# Patient Record
Sex: Male | Born: 1970
Health system: Southern US, Community
[De-identification: ages and names within clinical notes are randomized; demographics above are authoritative.]

## PROBLEM LIST (undated history)

## (undated) DIAGNOSIS — M1990 Unspecified osteoarthritis, unspecified site: Secondary | ICD-10-CM

## (undated) DIAGNOSIS — I10 Essential (primary) hypertension: Secondary | ICD-10-CM

## (undated) HISTORY — PX: KNEE SURGERY: SHX244

## (undated) HISTORY — DX: Unspecified osteoarthritis, unspecified site: M19.90

## (undated) HISTORY — PX: LEG SURGERY: SHX1003

---

## 2002-01-28 ENCOUNTER — Ambulatory Visit (HOSPITAL_BASED_OUTPATIENT_CLINIC_OR_DEPARTMENT_OTHER): Admission: RE | Admit: 2002-01-28 | Discharge: 2002-01-28 | Payer: Self-pay | Admitting: Internal Medicine

## 2005-05-19 ENCOUNTER — Encounter: Admission: RE | Admit: 2005-05-19 | Discharge: 2005-05-19 | Payer: Self-pay | Admitting: Family Medicine

## 2017-03-19 DIAGNOSIS — H1031 Unspecified acute conjunctivitis, right eye: Secondary | ICD-10-CM | POA: Diagnosis not present

## 2017-05-12 DIAGNOSIS — L400 Psoriasis vulgaris: Secondary | ICD-10-CM | POA: Diagnosis not present

## 2017-09-04 DIAGNOSIS — E891 Postprocedural hypoinsulinemia: Secondary | ICD-10-CM | POA: Diagnosis not present

## 2017-10-04 DIAGNOSIS — H52223 Regular astigmatism, bilateral: Secondary | ICD-10-CM | POA: Diagnosis not present

## 2017-12-10 ENCOUNTER — Encounter (HOSPITAL_COMMUNITY): Payer: Self-pay | Admitting: Emergency Medicine

## 2017-12-10 ENCOUNTER — Emergency Department (HOSPITAL_COMMUNITY): Payer: BLUE CROSS/BLUE SHIELD

## 2017-12-10 ENCOUNTER — Emergency Department (HOSPITAL_COMMUNITY)
Admission: EM | Admit: 2017-12-10 | Discharge: 2017-12-10 | Disposition: A | Discharge status: Home / Self Care | Payer: BLUE CROSS/BLUE SHIELD | Attending: Emergency Medicine | Admitting: Emergency Medicine

## 2017-12-10 DIAGNOSIS — R079 Chest pain, unspecified: Secondary | ICD-10-CM

## 2017-12-10 DIAGNOSIS — I10 Essential (primary) hypertension: Secondary | ICD-10-CM | POA: Insufficient documentation

## 2017-12-10 DIAGNOSIS — R0789 Other chest pain: Secondary | ICD-10-CM | POA: Insufficient documentation

## 2017-12-10 HISTORY — DX: Essential (primary) hypertension: I10

## 2017-12-10 LAB — CBC
HCT: 47.8 % (ref 39.0–52.0)
Hemoglobin: 16.9 g/dL (ref 13.0–17.0)
MCH: 33.1 pg (ref 26.0–34.0)
MCHC: 35.4 g/dL (ref 30.0–36.0)
MCV: 93.5 fL (ref 78.0–100.0)
PLATELETS: 193 10*3/uL (ref 150–400)
RBC: 5.11 MIL/uL (ref 4.22–5.81)
RDW: 12 % (ref 11.5–15.5)
WBC: 7 10*3/uL (ref 4.0–10.5)

## 2017-12-10 LAB — BASIC METABOLIC PANEL
Anion gap: 12 (ref 5–15)
BUN: 12 mg/dL (ref 6–20)
CALCIUM: 9.6 mg/dL (ref 8.9–10.3)
CO2: 24 mmol/L (ref 22–32)
CREATININE: 0.79 mg/dL (ref 0.61–1.24)
Chloride: 97 mmol/L — ABNORMAL LOW (ref 101–111)
GFR calc non Af Amer: >60 mL/min (ref >60)
Glucose, Bld: 103 mg/dL — ABNORMAL HIGH (ref 65–99)
Potassium: 3.4 mmol/L — ABNORMAL LOW (ref 3.5–5.1)
SODIUM: 133 mmol/L — AB (ref 135–145)

## 2017-12-10 LAB — I-STAT TROPONIN, ED
TROPONIN I, POC: 0 ng/mL (ref 0.00–0.08)
TROPONIN I, POC: 0.01 ng/mL (ref 0.00–0.08)

## 2017-12-10 NOTE — ED Triage Notes (Signed)
Patient c/o intermittent left sided chest pain for past week about 3 hours ago pt began having dull ache to left side of check radiating into left arm. Pt adds BP has also been increased this week and has taken lisinopril and HTZT as prescribed. Denies any difficulty breathing or nausea.

## 2017-12-10 NOTE — ED Provider Notes (Signed)
[facility] DEPT Provider Note   CSN: [id number] Arrival date & time: 12/10/17  1728     History   Chief Complaint Chief Complaint  Patient presents with  . Chest Pain    HPI John Frederick is a 46 y.o. male.  Patient is a 46 year old male with a history of hypertension who presents with chest pain.  He states is been intermittent for the last week.  It usually lasts about 45 minutes to an hour.  It is nonexertional.  He describes as an achy pain to his left chest.  Today started having some tingling in his left arm.  He has no associated shortness of breath.  No pleuritic pain.  No nausea or vomiting.  No diaphoresis.  No history of heart problems in the past.  He does have hypertension but his cholesterol levels have been normal per his report.  He does have a family history of heart disease in his mom and dad with what he thinks for blockages in their 32s.  He states the pain has been relieved with taking Tums.  He is currently pain-free.      Past Medical History:  Diagnosis Date  . Hypertension     There are no active problems to display for this patient.   Past Surgical History:  Procedure Laterality Date  . KNEE SURGERY Left   . LEG SURGERY Right        Home Medications    Prior to Admission medications   Medication Sig Start Date End Date Taking? Authorizing Provider  calcipotriene (DOVONOX) 0.005 % cream Apply 1 application topically daily.   Yes [provider]  glucosamine-chondroitin 500-400 MG tablet Take 2 tablets by mouth daily.   Yes [provider]  hydrochlorothiazide (MICROZIDE) 12.5 MG capsule Take 12.5 mg by mouth daily.   Yes [provider]  ibuprofen (ADVIL,MOTRIN) 200 MG tablet Take 400 mg by mouth every 6 (six) hours as needed for moderate pain.   Yes [provider]  lisinopril (PRINIVIL,ZESTRIL) 40 MG tablet Take 40 mg by mouth daily.   Yes [provider]     Family History No family history on file.  Social History Social History   Tobacco Use  . Smoking status: Never Smoker  . Smokeless tobacco: Never Used  Substance Use Topics  . Alcohol use: Yes  . Drug use: No     Allergies   Amoxicillin   Review of Systems Review of Systems  Constitutional: Negative for chills, diaphoresis, fatigue and fever.  HENT: Negative for congestion, rhinorrhea and sneezing.   Eyes: Negative.   Respiratory: Negative for cough, chest tightness and shortness of breath.   Cardiovascular: Positive for chest pain. Negative for leg swelling.  Gastrointestinal: Negative for abdominal pain, blood in stool, diarrhea, nausea and vomiting.  Genitourinary: Negative for difficulty urinating, flank pain, frequency and hematuria.  Musculoskeletal: Negative for arthralgias and back pain.  Skin: Negative for rash.  Neurological: Negative for dizziness, speech difficulty, weakness, numbness and headaches.     Physical Exam Updated Vital Signs BP 136/87   Pulse 88   Temp 98.2 F (36.8 C) (Oral)   Resp 13   SpO2 98%   Physical Exam  Constitutional: He is oriented to person, place, and time. He appears well-developed and well-nourished.  HENT:  Head: Normocephalic and atraumatic.  Eyes: Pupils are equal, round, and reactive to light.  Neck: Normal range of motion. Neck supple.  Cardiovascular: Normal rate, regular rhythm  and normal heart sounds.  Pulmonary/Chest: Effort normal and breath sounds normal. No respiratory distress. He has no wheezes. He has no rales. He exhibits no tenderness.  Abdominal: Soft. Bowel sounds are normal. There is no tenderness. There is no rebound and no guarding.  Musculoskeletal: Normal range of motion. He exhibits no edema.  No edema or calf tenderness  Lymphadenopathy:    He has no cervical adenopathy.  Neurological: He is alert and oriented to person, place, and time.  Skin: Skin is warm and dry. No rash noted.   Psychiatric: He has a normal mood and affect.     ED Treatments / Results  Labs (all labs ordered are listed, but only abnormal results are displayed) Labs Reviewed  BASIC METABOLIC PANEL - Abnormal; Notable for the following components:      Result Value   Sodium 133 (*)    Potassium 3.4 (*)    Chloride 97 (*)    Glucose, Bld 103 (*)    All other components within normal limits  CBC  I-STAT TROPONIN, ED  I-STAT TROPONIN, ED    EKG  EKG Interpretation  Date/Time:  Sunday December 10 2017 19:38:00 EST Ventricular Rate:  93 PR Interval:    QRS Duration: 107 QT Interval:  346 QTC Calculation: 431 R Axis:   107 Text Interpretation:  Right and left arm electrode reversal, interpretation assumes no reversal Sinus rhythm Right axis deviation Nonspecific T abnormalities, lateral leads since last tracing no significant change Confirmed by Malvin Johns [phone]) on 12/10/2017 7:40:42 PM       Radiology Dg Chest 2 View  Result Date: 12/10/2017 CLINICAL DATA:  Left upper chest on and off x 4-5 days; no known cardiopulmonary problems; non smoker. Hypertension EXAM: CHEST  2 VIEW COMPARISON:  None. FINDINGS: Shallow inspiration. Normal heart size and pulmonary vascularity. No focal airspace disease or consolidation in the lungs. No blunting of costophrenic angles. No pneumothorax. Mediastinal contours appear intact. IMPRESSION: No active cardiopulmonary disease. Electronically Signed   By: Lucienne Capers M.D.   On: 12/10/2017 18:20    Procedures Procedures (including critical care time)  Medications Ordered in ED Medications - No data to display   Initial Impression / Assessment and Plan / ED Course  I have reviewed the triage vital signs and the nursing notes.  Pertinent labs & imaging results that were available during my care of the patient were reviewed by me and considered in my medical decision making (see chart for details).    Patient is a 46 year old male who  presents with chest pain.  He has no associated symptoms.  No exertional symptoms.  No symptoms that would be more concerning for a PE.  He has a heart score of 2-3.  He has had 2- troponins.  He has had EKGs which have not showed any ischemic changes.  I feel he can be discharged home with close follow-up.  I did advise him to start a antiacid medication which he will get over-the-counter.  He does say that Tums had previously helped his pain.  He will follow-up with his PCP who is in the health clinic at his job site.  I advised him that he will need close follow-up and may be stress test if his symptoms continue.  I did give him a referral to follow-up with cardiology if he is unable to see the PCP at his health clinic.  Return precautions were given.  Final Clinical Impressions(s) / ED Diagnoses   Final  diagnoses:  Nonspecific chest pain    ED Discharge Orders    None       Malvin Johns, MD 12/10/17 [phone]

## 2017-12-20 ENCOUNTER — Encounter (INDEPENDENT_AMBULATORY_CARE_PROVIDER_SITE_OTHER): Payer: BLUE CROSS/BLUE SHIELD

## 2017-12-20 ENCOUNTER — Other Ambulatory Visit: Payer: Self-pay | Admitting: Family Medicine

## 2017-12-20 DIAGNOSIS — R079 Chest pain, unspecified: Secondary | ICD-10-CM

## 2017-12-20 LAB — EXERCISE TOLERANCE TEST
CHL CUP MPHR: 176 {beats}/min
CHL CUP RESTING HR STRESS: 84 {beats}/min
Estimated workload: 9.1 METS
Exercise duration (min): 7 min
Exercise duration (sec): 30 s
Peak HR: 162 {beats}/min
Percent HR: 92 %
RPE: 16

## 2017-12-26 ENCOUNTER — Telehealth: Payer: Self-pay | Admitting: Cardiovascular Disease

## 2017-12-26 ENCOUNTER — Encounter: Payer: Self-pay | Admitting: Cardiovascular Disease

## 2017-12-26 NOTE — Telephone Encounter (Signed)
Called pt and left message asking pt to call back to update his Fm and Medical history and to see who is the pt's PCP.

## 2018-01-08 NOTE — Progress Notes (Signed)
Cardiology Office Note   Date:  01/10/2018   ID:  John Frederick, DOB 15-Apr-1971, MRN [id number]  PCP:  Patient, No Pcp Per  Cardiologist:   Jenkins Rouge, MD   No chief complaint on file.     History of Present Illness: John Frederick is a 47 y.o. male who presents for consultation regarding chest pain. Referred by John Frederick ED Dr Tamera Punt. And Dr Kayleen Memos MD   Reviewed note from 12/10/17  CRF;s HTN.  Pain for a week. Intermittent Can last as long as an hour Non exertional achy pain on left side of chest. Associated with tingling in left arm No pleurisy or dyspnea. Family  History of premature CAD in both parents 7's Tums does help with pain . R/O ECG with no acute changes  CXR with NAD. Reviewed ETT done 12/20/17  John Frederick 7:30 minuts Max HR 162 92%  9.1 METS  HTN response 270 mmHg systolic  No ischemia   He does not exercise much Diet not great Drinks beer daily and probably excessively. Has not had Pains since ETT. 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 Hypertension     Past Surgical History:  Procedure Laterality Date  . KNEE SURGERY Left   . LEG SURGERY Right      Current Outpatient Medications  Medication Sig Dispense Refill  . calcipotriene (DOVONOX) 0.005 % cream Apply 1 application topically daily.    Marland Kitchen glucosamine-chondroitin 500-400 MG tablet Take 2 tablets by mouth daily.    . hydrochlorothiazide (MICROZIDE) 12.5 MG capsule Take 12.5 mg by mouth daily.    Marland Kitchen ibuprofen (ADVIL,MOTRIN) 200 MG tablet Take 400 mg by mouth every 6 (six) hours as needed for moderate pain.    Marland Kitchen lisinopril (PRINIVIL,ZESTRIL) 40 MG tablet Take 40 mg by mouth daily.     No current facility-administered medications for this visit.     Allergies:   Amoxicillin    Social History:  The patient  reports that  has never smoked. he has never used smokeless tobacco. He reports that he  drinks alcohol. He reports that he does not use drugs.   Family History:  The patient's family history includes Alzheimer's disease in his maternal grandmother and paternal grandmother; Heart Problems in his father; Hypertension in his mother.    ROS:  Please see the history of present illness.   Otherwise, review of systems are positive for none.   All other systems are reviewed and negative.    PHYSICAL EXAM: VS:  BP (!) 152/88   Pulse 97   Ht 5' 10.5" (1.791 m)   Wt 264 lb 3.2 oz (119.8 kg)   SpO2 98%   BMI 37.37 kg/m  , BMI Body mass index is 37.37 kg/m. Affect appropriate Overweight white male  HEENT: normal Neck supple with no adenopathy JVP normal no bruits no thyromegaly Lungs clear with no wheezing and good diaphragmatic motion Heart:  S1/S2 no murmur, no rub, gallop or click PMI normal Abdomen: benighn, BS positve, no tenderness, no AAA no bruit.  No HSM or HJR Distal pulses intact with no bruits No edema Neuro non-focal Skin warm and dry No muscular weakness    EKG:  12/11/17 SR rate 87 normal    Recent Labs: 12/10/2017: BUN 12; Creatinine, Ser 0.79; Hemoglobin 16.9; Platelets 193; Potassium 3.4; Sodium 133    Lipid Panel No results  found for: CHOL, TRIG, HDL, CHOLHDL, VLDL, LDLCALC, LDLDIRECT    Wt Readings from Last 3 Encounters:  01/10/18 264 lb 3.2 oz (119.8 kg)      Other studies Reviewed: Additional studies/ records that were reviewed today include: ER notes, Labs CXR ECG F/U ETT .    ASSESSMENT AND PLAN:  1.  Chest pain:  Atypical non exertional r/o relief with TUMS Normal ETT done 12/20/17 likely non cardiac offered him calcium scoring as well to risk stratify in long term  2. HTN: borderline control continue diuretic and ACE discussed lifestyle changes weight loss and diet Also discussed issues ETOH and labile BP  3. GERD: PRN H2 blocker 4. ETOH:  Discussed intake as being excessive and abnormal used term "alcoholism" with him no  family History of such but has 4 or more beers nightly    Current medicines are reviewed at length with the patient today.  The patient does not have concerns regarding medicines.  The following changes have been made:  None   Labs/ tests ordered today include: None  No orders of the defined types were placed in this encounter.    Disposition:   FU with cardiology PRN      Signed, Jenkins Rouge, MD  01/10/2018 8:45 AM    [facility] [address], [place], [place]  [address] Phone: [phone]; Fax: [phone]

## 2018-01-10 ENCOUNTER — Encounter: Payer: Self-pay | Admitting: Cardiovascular Disease

## 2018-01-10 ENCOUNTER — Ambulatory Visit: Payer: BLUE CROSS/BLUE SHIELD | Admitting: Cardiovascular Disease

## 2018-01-10 VITALS — BP 152/88 | HR 97 | Ht 70.5 in | Wt 264.2 lb

## 2018-01-10 DIAGNOSIS — R079 Chest pain, unspecified: Secondary | ICD-10-CM | POA: Diagnosis not present

## 2018-01-10 DIAGNOSIS — I10 Essential (primary) hypertension: Secondary | ICD-10-CM | POA: Diagnosis not present

## 2018-01-10 NOTE — Patient Instructions (Addendum)
Medication Instructions:  Your physician recommends that you continue on your current medications as directed. Please refer to the Current Medication list given to you today.  Labwork: NONE  Testing/Procedures: NONE  Follow-Up: Your physician wants you to follow-up as needed with  Dr. Nishan.    If you need a refill on your cardiac medications before your next appointment, please call your pharmacy.    

## 2019-08-22 ENCOUNTER — Other Ambulatory Visit: Payer: Self-pay

## 2019-08-22 DIAGNOSIS — R6889 Other general symptoms and signs: Secondary | ICD-10-CM | POA: Diagnosis not present

## 2019-08-22 DIAGNOSIS — Z20822 Contact with and (suspected) exposure to covid-19: Secondary | ICD-10-CM

## 2019-08-23 LAB — NOVEL CORONAVIRUS, NAA: SARS-CoV-2, NAA: NOT DETECTED

## 2019-10-01 DIAGNOSIS — L400 Psoriasis vulgaris: Secondary | ICD-10-CM | POA: Diagnosis not present

## 2019-10-03 ENCOUNTER — Other Ambulatory Visit: Payer: Self-pay

## 2019-10-03 DIAGNOSIS — Z20822 Contact with and (suspected) exposure to covid-19: Secondary | ICD-10-CM

## 2019-10-03 DIAGNOSIS — Z20828 Contact with and (suspected) exposure to other viral communicable diseases: Secondary | ICD-10-CM | POA: Diagnosis not present

## 2019-10-06 LAB — NOVEL CORONAVIRUS, NAA: SARS-CoV-2, NAA: NOT DETECTED

## 2019-11-04 ENCOUNTER — Other Ambulatory Visit: Payer: Self-pay

## 2019-11-04 DIAGNOSIS — Z20822 Contact with and (suspected) exposure to covid-19: Secondary | ICD-10-CM

## 2019-11-05 LAB — NOVEL CORONAVIRUS, NAA: SARS-CoV-2, NAA: NOT DETECTED

## 2019-11-27 ENCOUNTER — Other Ambulatory Visit: Payer: Self-pay

## 2019-11-27 ENCOUNTER — Ambulatory Visit: Payer: BC Managed Care – PPO | Attending: Internal Medicine

## 2019-11-27 DIAGNOSIS — Z20822 Contact with and (suspected) exposure to covid-19: Secondary | ICD-10-CM

## 2019-11-27 DIAGNOSIS — Z20828 Contact with and (suspected) exposure to other viral communicable diseases: Secondary | ICD-10-CM | POA: Diagnosis not present

## 2019-11-29 ENCOUNTER — Ambulatory Visit: Payer: BC Managed Care – PPO | Attending: Internal Medicine

## 2019-11-29 DIAGNOSIS — Z20828 Contact with and (suspected) exposure to other viral communicable diseases: Secondary | ICD-10-CM | POA: Diagnosis not present

## 2019-11-29 DIAGNOSIS — Z20822 Contact with and (suspected) exposure to covid-19: Secondary | ICD-10-CM

## 2019-11-29 LAB — NOVEL CORONAVIRUS, NAA: SARS-CoV-2, NAA: NOT DETECTED

## 2019-11-30 LAB — NOVEL CORONAVIRUS, NAA: SARS-CoV-2, NAA: NOT DETECTED

## 2019-12-09 ENCOUNTER — Ambulatory Visit: Payer: BC Managed Care – PPO | Attending: Internal Medicine

## 2019-12-09 DIAGNOSIS — Z20828 Contact with and (suspected) exposure to other viral communicable diseases: Secondary | ICD-10-CM | POA: Diagnosis not present

## 2019-12-09 DIAGNOSIS — Z20822 Contact with and (suspected) exposure to covid-19: Secondary | ICD-10-CM

## 2019-12-09 DIAGNOSIS — Z9889 Other specified postprocedural states: Secondary | ICD-10-CM | POA: Diagnosis not present

## 2019-12-09 DIAGNOSIS — M1712 Unilateral primary osteoarthritis, left knee: Secondary | ICD-10-CM | POA: Diagnosis not present

## 2019-12-11 LAB — NOVEL CORONAVIRUS, NAA: SARS-CoV-2, NAA: NOT DETECTED

## 2020-01-15 ENCOUNTER — Ambulatory Visit: Payer: BC Managed Care – PPO | Attending: Internal Medicine

## 2020-01-15 DIAGNOSIS — Z20822 Contact with and (suspected) exposure to covid-19: Secondary | ICD-10-CM | POA: Diagnosis not present

## 2020-01-16 LAB — NOVEL CORONAVIRUS, NAA: SARS-CoV-2, NAA: NOT DETECTED

## 2020-09-01 ENCOUNTER — Other Ambulatory Visit: Payer: BC Managed Care – PPO

## 2020-09-01 DIAGNOSIS — Z20822 Contact with and (suspected) exposure to covid-19: Secondary | ICD-10-CM | POA: Diagnosis not present

## 2020-09-03 LAB — NOVEL CORONAVIRUS, NAA

## 2020-10-17 ENCOUNTER — Other Ambulatory Visit: Payer: BC Managed Care – PPO

## 2020-11-27 ENCOUNTER — Other Ambulatory Visit: Payer: BC Managed Care – PPO

## 2020-11-27 DIAGNOSIS — Z20822 Contact with and (suspected) exposure to covid-19: Secondary | ICD-10-CM

## 2020-11-28 LAB — SARS-COV-2, NAA 2 DAY TAT

## 2020-11-28 LAB — NOVEL CORONAVIRUS, NAA: SARS-CoV-2, NAA: NOT DETECTED

## 2020-12-14 ENCOUNTER — Other Ambulatory Visit: Payer: BC Managed Care – PPO

## 2020-12-14 DIAGNOSIS — Z20822 Contact with and (suspected) exposure to covid-19: Secondary | ICD-10-CM

## 2020-12-15 LAB — SARS-COV-2, NAA 2 DAY TAT

## 2020-12-15 LAB — SPECIMEN STATUS REPORT

## 2020-12-15 LAB — NOVEL CORONAVIRUS, NAA: SARS-CoV-2, NAA: NOT DETECTED

## 2020-12-21 ENCOUNTER — Other Ambulatory Visit: Payer: Self-pay

## 2020-12-23 ENCOUNTER — Other Ambulatory Visit: Payer: 59

## 2021-07-14 ENCOUNTER — Encounter: Payer: Self-pay | Admitting: Gastroenterology

## 2021-08-06 ENCOUNTER — Encounter: Payer: Self-pay | Admitting: Gastroenterology

## 2021-09-23 ENCOUNTER — Encounter: Payer: 59 | Admitting: Gastroenterology

## 2021-10-01 ENCOUNTER — Other Ambulatory Visit: Payer: Self-pay

## 2021-10-01 ENCOUNTER — Encounter: Payer: Self-pay | Admitting: Gastroenterology

## 2021-10-01 ENCOUNTER — Ambulatory Visit (AMBULATORY_SURGERY_CENTER): Payer: 59

## 2021-10-01 VITALS — Ht 71.0 in | Wt 265.0 lb

## 2021-10-01 DIAGNOSIS — Z1211 Encounter for screening for malignant neoplasm of colon: Secondary | ICD-10-CM

## 2021-10-01 NOTE — Progress Notes (Signed)

## 2021-10-15 ENCOUNTER — Ambulatory Visit (AMBULATORY_SURGERY_CENTER): Payer: 59 | Admitting: Gastroenterology

## 2021-10-15 ENCOUNTER — Encounter: Payer: Self-pay | Admitting: Gastroenterology

## 2021-10-15 ENCOUNTER — Other Ambulatory Visit: Payer: Self-pay

## 2021-10-15 VITALS — BP 128/85 | HR 73 | Temp 96.8°F | Resp 10 | Ht 71.0 in | Wt 265.0 lb

## 2021-10-15 DIAGNOSIS — D123 Benign neoplasm of transverse colon: Secondary | ICD-10-CM

## 2021-10-15 DIAGNOSIS — Z1211 Encounter for screening for malignant neoplasm of colon: Secondary | ICD-10-CM | POA: Diagnosis present

## 2021-10-15 DIAGNOSIS — D122 Benign neoplasm of ascending colon: Secondary | ICD-10-CM | POA: Diagnosis not present

## 2021-10-15 DIAGNOSIS — D128 Benign neoplasm of rectum: Secondary | ICD-10-CM

## 2021-10-15 MED ORDER — SODIUM CHLORIDE 0.9 % IV SOLN: 500.0000 mL | Freq: Once | INTRAVENOUS | Status: DC

## 2021-10-15 NOTE — Progress Notes (Signed)
[facility] History and Physical   Primary Care Physician:  Waldemar Dickens, MD   Reason for Procedure:   Colon cancer screening  Plan:    Screening colonoscopy     HPI: John Frederick is a 50 y.o. male undergoing initial average risk screening colonoscopy.  He has no chronic GI symptoms and no family history of colon cancer.   Past Medical History:  Diagnosis Date   Arthritis    left knee - no meds   Hypertension     Past Surgical History:  Procedure Laterality Date   KNEE SURGERY Left    LEG SURGERY Right     Prior to Admission medications   Medication Sig Start Date End Date Taking? Authorizing Provider  calcipotriene (DOVONOX) 0.005 % cream Apply 1 application topically daily.   Yes [provider]  glucosamine-chondroitin 500-400 MG tablet Take 2 tablets by mouth daily.   Yes [provider]  hydrochlorothiazide (MICROZIDE) 12.5 MG capsule Take 12.5 mg by mouth daily.   Yes [provider]  lisinopril (PRINIVIL,ZESTRIL) 40 MG tablet Take 40 mg by mouth daily.   Yes [provider]  metoprolol succinate (TOPROL-XL) 25 MG 24 hr tablet 25 mg daily. 07/31/20  Yes [provider]  ibuprofen (ADVIL,MOTRIN) 200 MG tablet Take 400 mg by mouth every 6 (six) hours as needed for moderate pain.    [provider]    Current Outpatient Medications  Medication Sig Dispense Refill   calcipotriene (DOVONOX) 0.005 % cream Apply 1 application topically daily.     glucosamine-chondroitin 500-400 MG tablet Take 2 tablets by mouth daily.     hydrochlorothiazide (MICROZIDE) 12.5 MG capsule Take 12.5 mg by mouth daily.     lisinopril (PRINIVIL,ZESTRIL) 40 MG tablet Take 40 mg by mouth daily.     metoprolol succinate (TOPROL-XL) 25 MG 24 hr tablet 25 mg daily.     ibuprofen (ADVIL,MOTRIN) 200 MG tablet Take 400 mg by mouth every 6 (six) hours as needed for moderate pain.     Current Facility-Administered Medications   Medication Dose Route Frequency Provider Last Rate Last Admin   0.9 %  sodium chloride infusion  500 mL Intravenous Once Daryel November, MD        Allergies as of 10/15/2021 - Review Complete 10/15/2021  Allergen Reaction Noted   Amoxicillin Rash 12/10/2017    Family History  Problem Relation Age of Onset   Hypertension Mother    Heart Problems Father    Alzheimer's disease Maternal Grandmother    Alzheimer's disease Paternal Grandmother    Colon cancer Neg Hx    Colon polyps Neg Hx    Esophageal cancer Neg Hx    Rectal cancer Neg Hx    Stomach cancer Neg Hx     Social History   Socioeconomic History   Marital status: Married    Spouse name: PAULA   Number of children: 0   Years of education: COLLEGE   Highest education level: Not on file  Occupational History   Occupation: [employer]  Tobacco Use   Smoking status: Former    Types: Cigarettes    Quit date: 12/13/2003    Years since quitting: 17.8   Smokeless tobacco: Never  Vaping Use   Vaping Use: Never used  Substance and Sexual Activity   Alcohol use: Yes    Alcohol/week: 28.0 standard drinks    Types: 28 Cans of beer per week    Comment: daily beer 4 daily  Drug use: No   Sexual activity: Not on file  Other Topics Concern   Not on file  Social History Narrative   Not on file   Social Determinants of Health   Financial Resource Strain: Not on file  Food Insecurity: Not on file  Transportation Needs: Not on file  Physical Activity: Not on file  Stress: Not on file  Social Connections: Not on file  Intimate Partner Violence: Not on file    Review of Systems:  All other review of systems negative except as mentioned in the HPI.  Physical Exam: Vital signs BP 132/83   Pulse 80   Temp (!) 96.8 F (36 C) (Temporal)   Ht 5\' 11"  (1.803 m)   Wt 265 lb (120.2 kg)   SpO2 98%   BMI 36.96 kg/m   General:   Alert,  Well-developed, well-nourished, pleasant and cooperative in NAD. MP3 Lungs:   Clear throughout to auscultation.   Heart:  Regular rate and rhythm; no murmurs, clicks, rubs,  or gallops. Abdomen:  Soft, nontender and nondistended. Normal bowel sounds.   Neuro/Psych:  Normal mood and affect. A and O x 3   Bryce Cheever E. Candis Schatz, MD [facility]

## 2021-10-15 NOTE — Patient Instructions (Signed)
Discharge instructions given. Handouts on polyps and diverticulosis. Resume previous medications. YOU HAD AN ENDOSCOPIC PROCEDURE TODAY AT THE [facility]:   Refer to the procedure report that was given to you for any specific questions about what was found during the examination.  If the procedure report does not answer your questions, please call your gastroenterologist to clarify.  If you requested that your care partner not be given the details of your procedure findings, then the procedure report has been included in a sealed envelope for you to review at your convenience later.  YOU SHOULD EXPECT: Some feelings of bloating in the abdomen. Passage of more gas than usual.  Walking can help get rid of the air that was put into your GI tract during the procedure and reduce the bloating. If you had a lower endoscopy (such as a colonoscopy or flexible sigmoidoscopy) you may notice spotting of blood in your stool or on the toilet paper. If you underwent a bowel prep for your procedure, you may not have a normal bowel movement for a few days.  Please Note:  You might notice some irritation and congestion in your nose or some drainage.  This is from the oxygen used during your procedure.  There is no need for concern and it should clear up in a day or so.  SYMPTOMS TO REPORT IMMEDIATELY:   Following lower endoscopy (colonoscopy or flexible sigmoidoscopy):  Excessive amounts of blood in the stool  Significant tenderness or worsening of abdominal pains  Swelling of the abdomen that is new, acute  Fever of 100F or higher   For urgent or emergent issues, a gastroenterologist can be reached at any hour by calling [phone]. Do not use MyChart messaging for urgent concerns.    DIET:  We do recommend a small meal at first, but then you may proceed to your regular diet.  Drink plenty of fluids but you should avoid alcoholic beverages for 24 hours.  ACTIVITY:  You should plan to take  it easy for the rest of today and you should NOT DRIVE or use heavy machinery until tomorrow (because of the sedation medicines used during the test).    FOLLOW UP: Our staff will call the number listed on your records 48-72 hours following your procedure to check on you and address any questions or concerns that you may have regarding the information given to you following your procedure. If we do not reach you, we will leave a message.  We will attempt to reach you two times.  During this call, we will ask if you have developed any symptoms of COVID 19. If you develop any symptoms (ie: fever, flu-like symptoms, shortness of breath, cough etc.) before then, please call [phone].  If you test positive for Covid 19 in the 2 weeks post procedure, please call and report this information to us.    If any biopsies were taken you will be contacted by phone or by letter within the next 1-3 weeks.  Please call us at [phone] if you have not heard about the biopsies in 3 weeks.    SIGNATURES/CONFIDENTIALITY: You and/or your care partner have signed paperwork which will be entered into your electronic medical record.  These signatures attest to the fact that that the information above on your After Visit Summary has been reviewed and is understood.  Full responsibility of the confidentiality of this discharge information lies with you and/or your care-partner. 

## 2021-10-15 NOTE — Op Note (Signed)
[facility] Patient Name: John Frederick Procedure Date: 10/15/2021 9:42 AM MRN: [id number] Endoscopist: Nicki Reaper E. Candis Schatz , MD Age: 50 Referring MD:  Date of Birth: Mar 29, 1971 Gender: Male Account #: [id number] Procedure:                Colonoscopy Indications:              Screening for colorectal malignant neoplasm, This                            is the patient's first colonoscopy Medicines:                Monitored Anesthesia Care Procedure:                Pre-Anesthesia Assessment:                           - Prior to the procedure, a History and Physical                            was performed, and patient medications and                            allergies were reviewed. The patient's tolerance of                            previous anesthesia was also reviewed. The risks                            and benefits of the procedure and the sedation                            options and risks were discussed with the patient.                            All questions were answered, and informed consent                            was obtained. Prior Anticoagulants: The patient has                            taken no previous anticoagulant or antiplatelet                            agents. ASA Grade Assessment: II - A patient with                            mild systemic disease. After reviewing the risks                            and benefits, the patient was deemed in                            satisfactory condition to undergo the procedure.  After obtaining informed consent, the colonoscope                            was passed under direct vision. Throughout the                            procedure, the patient's blood pressure, pulse, and                            oxygen saturations were monitored continuously. The                            CF [id number] #[id number] was introduced through the anus                            and advanced to the  the terminal ileum, with                            identification of the appendiceal orifice and IC                            valve. The colonoscopy was performed without                            difficulty. The patient tolerated the procedure                            well. The quality of the bowel preparation was                            adequate. The terminal ileum, ileocecal valve,                            appendiceal orifice, and rectum were photographed.                            The bowel preparation used was Miralax via split                            dose instruction. Scope In: 9:52:01 AM Scope Out: 10:15:36 AM Scope Withdrawal Time: 0 hours 17 minutes 2 seconds  Total Procedure Duration: 0 hours 23 minutes 35 seconds  Findings:                 The perianal and digital rectal examinations were                            normal. Pertinent negatives include normal                            sphincter tone and no palpable rectal lesions.                           A 10 mm polyp was found in the ascending colon. The  polyp was sessile. The polyp was removed with a                            cold snare. Resection and retrieval were complete.                            Estimated blood loss was minimal.                           Three sessile polyps were found in the transverse                            colon. The polyps were 4 to 7 mm in size. These                            polyps were removed with a cold snare. Resection                            and retrieval were complete. Estimated blood loss                            was minimal.                           A 3 mm polyp was found in the rectum. The polyp was                            sessile. The polyp was removed with a cold snare.                            Resection and retrieval were complete. Estimated                            blood loss was minimal.                           Many  small and large-mouthed diverticula were found                            in the sigmoid colon, descending colon, transverse                            colon and ascending colon. There was no evidence of                            diverticular bleeding.                           The exam was otherwise normal throughout the                            examined colon.  The terminal ileum appeared normal.                           The retroflexed view of the distal rectum and anal                            verge was normal and showed no anal or rectal                            abnormalities. Complications:            No immediate complications. Estimated Blood Loss:     Estimated blood loss was minimal. Impression:               - One 10 mm polyp in the ascending colon, removed                            with a cold snare. Resected and retrieved.                           - Three 4 to 7 mm polyps in the transverse colon,                            removed with a cold snare. Resected and retrieved.                           - One 3 mm polyp in the rectum, removed with a cold                            snare. Resected and retrieved.                           - Moderate diverticulosis in the sigmoid colon, in                            the descending colon, in the transverse colon and                            in the ascending colon. There was no evidence of                            diverticular bleeding.                           - The examined portion of the ileum was normal.                           - The distal rectum and anal verge are normal on                            retroflexion view. Recommendation:           - Patient has a contact number available for  emergencies. The signs and symptoms of potential                            delayed complications were discussed with the                            patient. Return to normal  activities tomorrow.                            Written discharge instructions were provided to the                            patient.                           - Resume previous diet.                           - Continue present medications.                           - Await pathology results.                           - Repeat colonoscopy in 3 years for surveillance. Margarita Bobrowski E. Candis Schatz, MD 10/15/2021 10:22:18 AM This report has been signed electronically.

## 2021-10-15 NOTE — Progress Notes (Signed)
PT taken to PACU. Monitors in place. VSS. Report given to RN. 

## 2021-10-15 NOTE — Progress Notes (Signed)
VS completed by AG.    Pt's states no medical or surgical changes since previsit or office visit.

## 2021-10-15 NOTE — Progress Notes (Signed)
Called to room to assist during endoscopic procedure.  Patient ID and intended procedure confirmed with present staff. Received instructions for my participation in the procedure from the performing physician.  

## 2021-10-19 ENCOUNTER — Telehealth: Payer: Self-pay | Admitting: *Deleted

## 2021-10-19 NOTE — Telephone Encounter (Signed)
  Follow up Call-  Call back number 10/15/2021  Post procedure Call Back phone  # [phone]  Permission to leave phone message Yes  Some recent data might be hidden     Patient questions:  Do you have a fever, pain , or abdominal swelling? No. Pain Score  0 *  Have you tolerated food without any problems? Yes.    Have you been able to return to your normal activities? Yes.    Do you have any questions about your discharge instructions: Diet   No. Medications  No. Follow up visit  No.  Do you have questions or concerns about your Care? No.  Actions: * If pain score is 4 or above: No action needed, pain <4.  Have you developed a fever since your procedure? no  2.   Have you had an respiratory symptoms (SOB or cough) since your procedure? no  3.   Have you tested positive for COVID 19 since your procedure no  4.   Have you had any family members/close contacts diagnosed with the COVID 19 since your procedure?  no   If yes to any of these questions please route to Joylene John, RN and Joella Prince, RN

## 2021-10-25 NOTE — Progress Notes (Signed)
John Frederick,   All the polyps that I removed during your recent procedure were completely benign but were proven to be "pre-cancerous" polyps that MAY have grown into cancers if they had not been removed.  Studies shows that at least 20% of women over age 48 and 30% of men over age 27 have pre-cancerous polyps.  Based on current nationally recognized surveillance guidelines, I recommend that you have a repeat colonoscopy in 3 years.   If you develop any new rectal bleeding, abdominal pain or significant bowel habit changes, please contact me before then.

## 2022-02-01 ENCOUNTER — Ambulatory Visit
Admission: RE | Admit: 2022-02-01 | Discharge: 2022-02-01 | Disposition: A | Discharge status: Home / Self Care | Payer: 59 | Source: Ambulatory Visit | Attending: Family Medicine | Admitting: Family Medicine

## 2022-02-01 ENCOUNTER — Other Ambulatory Visit: Payer: Self-pay

## 2022-02-01 ENCOUNTER — Other Ambulatory Visit: Payer: Self-pay | Admitting: Family Medicine

## 2022-02-01 DIAGNOSIS — J189 Pneumonia, unspecified organism: Secondary | ICD-10-CM

## 2022-02-01 NOTE — Progress Notes (Signed)
6 weeks of cough that has not resolved w/ Azithromycin and prednisone. Continues to have productive cough.

## 2022-02-28 ENCOUNTER — Other Ambulatory Visit: Payer: Self-pay | Admitting: Family Medicine

## 2022-02-28 DIAGNOSIS — R079 Chest pain, unspecified: Secondary | ICD-10-CM

## 2022-02-28 NOTE — Progress Notes (Signed)
Recurrent CAP and chest pain ?

## 2022-03-01 ENCOUNTER — Ambulatory Visit
Admission: RE | Admit: 2022-03-01 | Discharge: 2022-03-01 | Disposition: A | Discharge status: Home / Self Care | Payer: 59 | Source: Ambulatory Visit | Attending: Family Medicine | Admitting: Family Medicine

## 2022-03-01 DIAGNOSIS — R079 Chest pain, unspecified: Secondary | ICD-10-CM

## 2022-03-01 MED ORDER — IOPAMIDOL (ISOVUE-300) INJECTION 61%
75.0000 mL | Freq: Once | INTRAVENOUS | Status: AC | PRN
  Administered 2022-03-01: 75 mL via INTRAVENOUS

## 2022-03-31 ENCOUNTER — Institutional Professional Consult (permissible substitution): Payer: 59 | Admitting: Pulmonary Disease

## 2022-09-23 IMAGING — CT CT CHEST W/ CM
2 of 5 series · 15 of 36 positions shown, 18 images · IV contrast (agent unspecified)
Comparison: None.

CLINICAL DATA: Chest pain

EXAM:
CT CHEST WITH CONTRAST
TECHNIQUE: Multidetector CT imaging of the chest was performed during
intravenous contrast administration.

[Series 4: chest 2.00 br40 s3 · coronal · 0.69mm/px · 3 of 185 slices shown]
[im 37/185  lung]
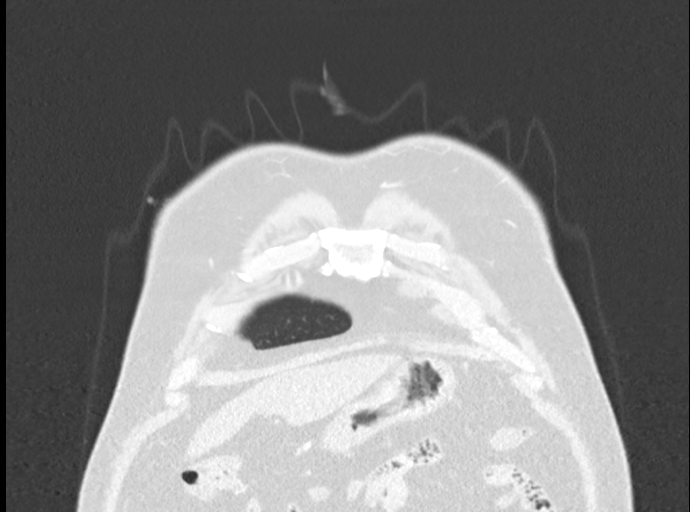
[im 74/185  lung]
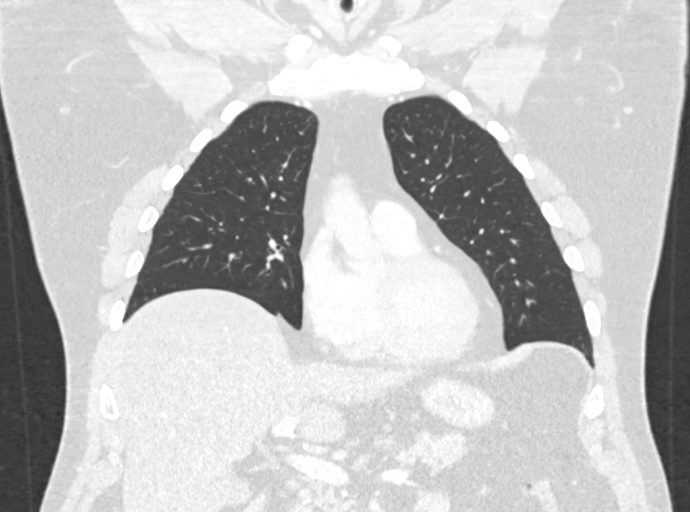
[im 111/185  lung]
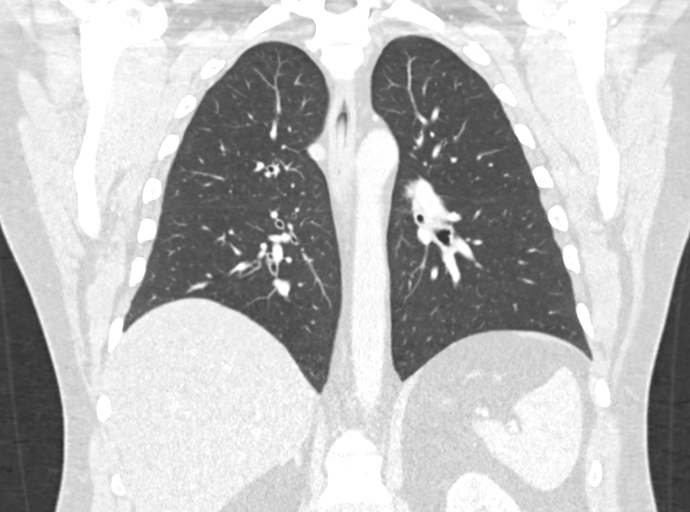

[Series 10: chest 1.00 br40 s3 · axial · 0.87mm/px · z∈[+1285,+1589]mm · 12 of 440 slices shown, 15 images]
[im 30/440  mediastinal]
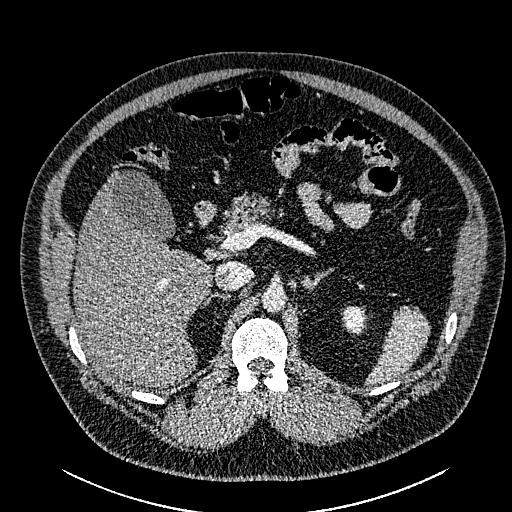
[im 30/440  lung]
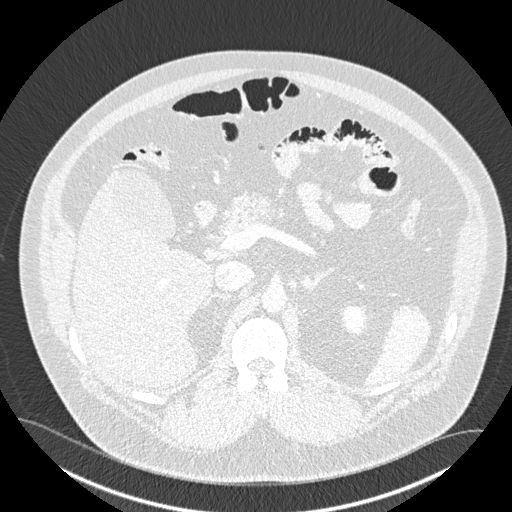
[im 59/440  lung]
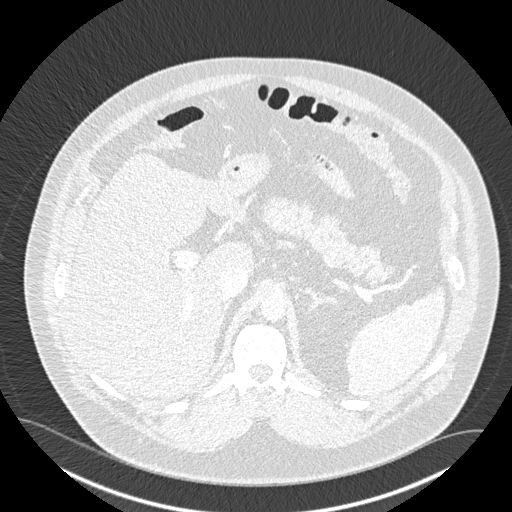
[im 88/440  lung]
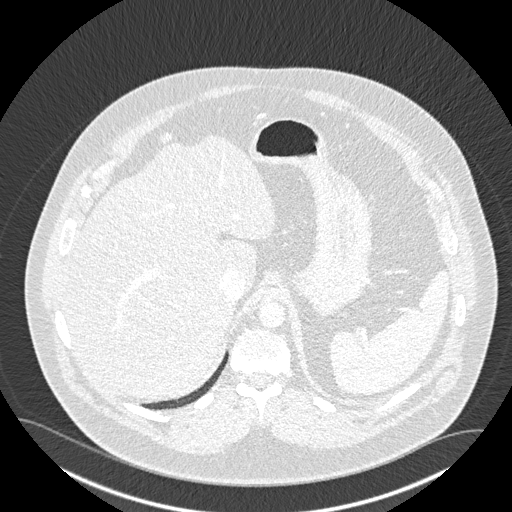
[im 147/440  lung]
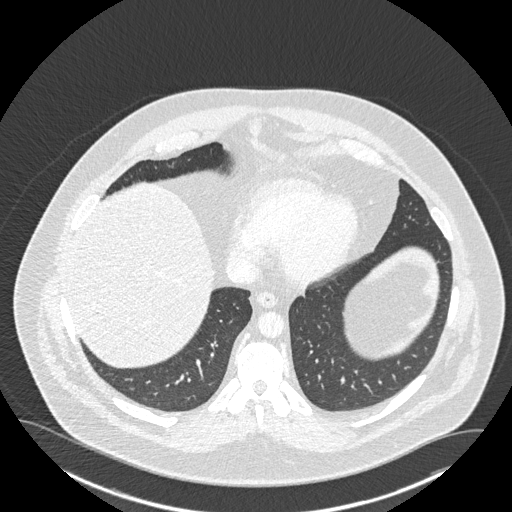
[im 176/440  mediastinal]
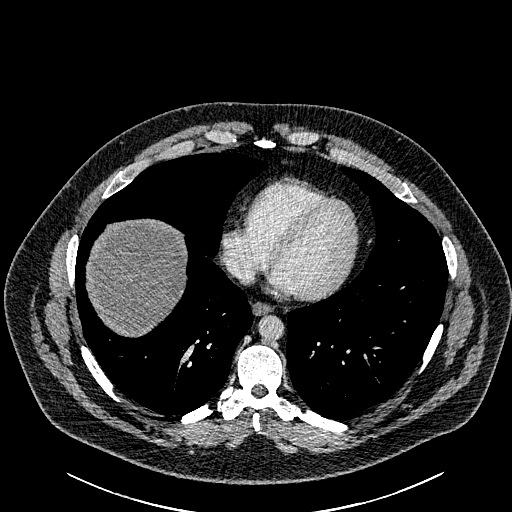
[im 176/440  lung]
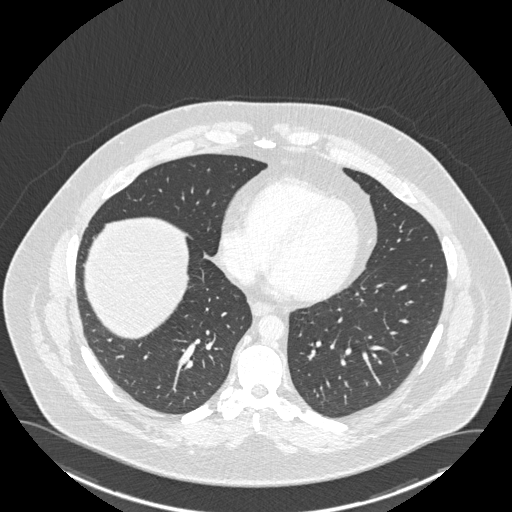
[im 205/440  lung]
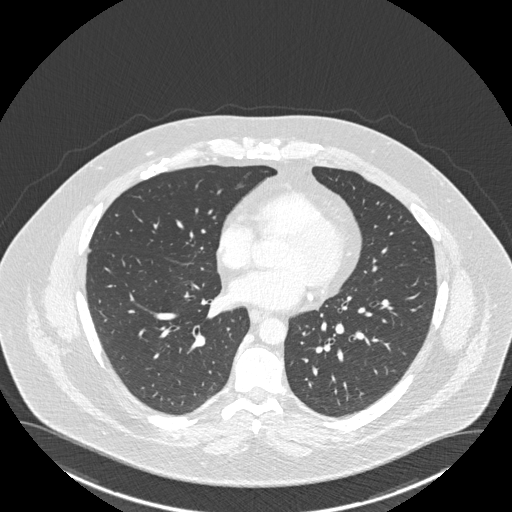
[im 235/440  lung]
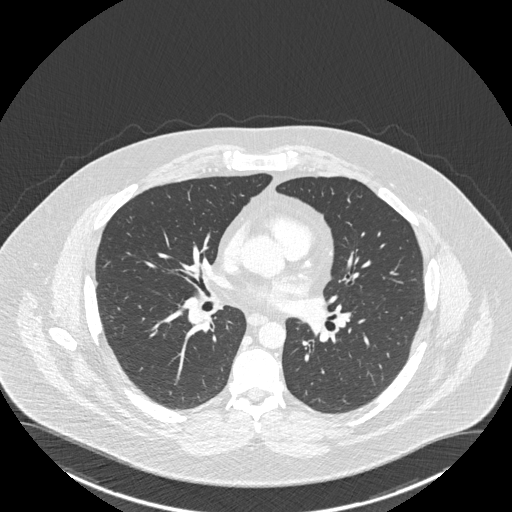
[im 264/440  lung]
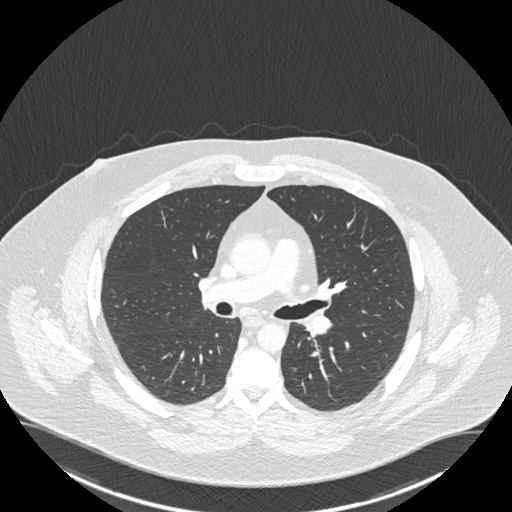
[im 293/440  mediastinal]
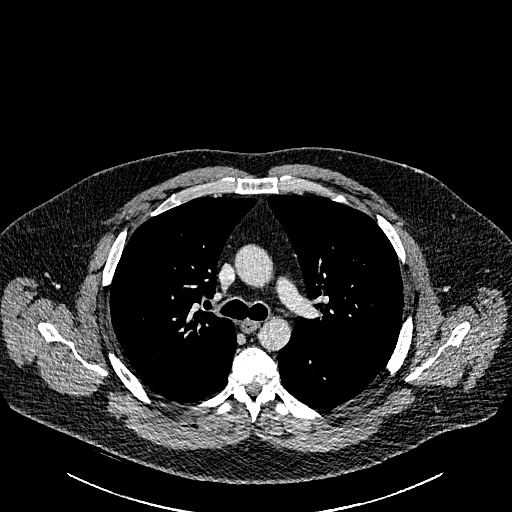
[im 293/440  lung]
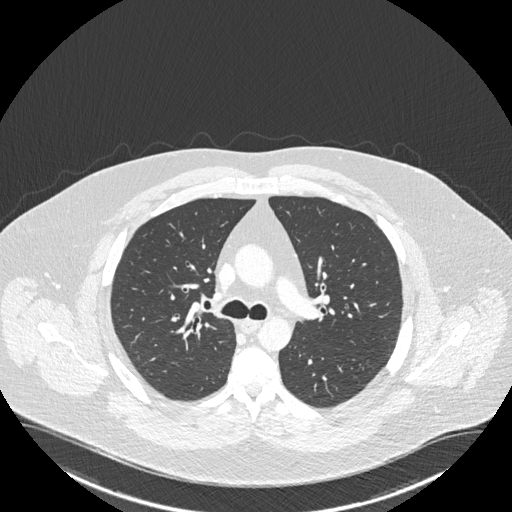
[im 352/440  lung]
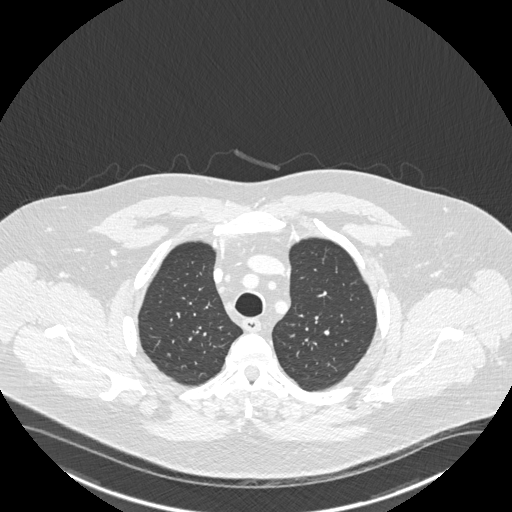
[im 381/440  lung]
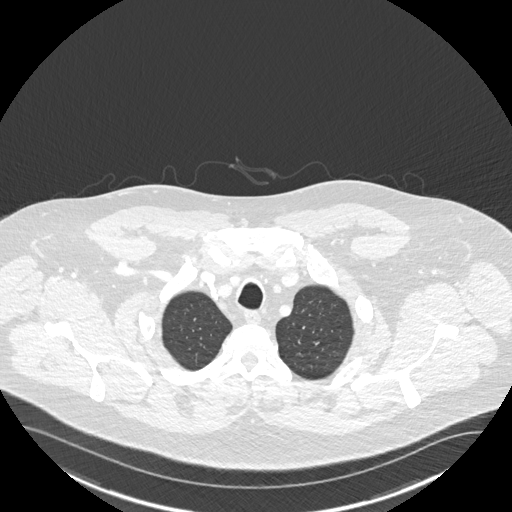
[im 410/440  lung]
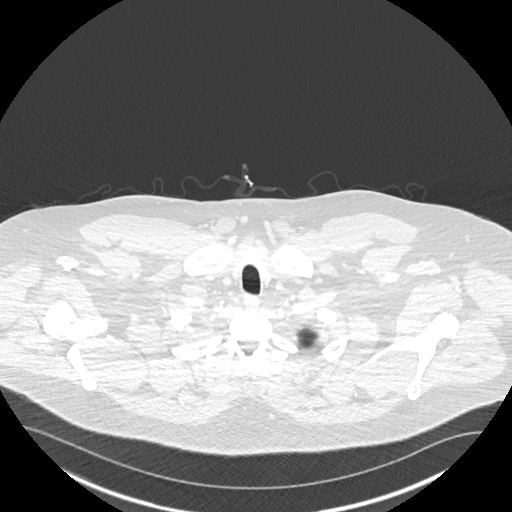

[15 of 36 positions shown; findings below may reference images not displayed]

RADIATION DOSE REDUCTION: This exam was performed according to the
departmental dose-optimization program which includes automated
exposure control, adjustment of the mA and/or kV according to
patient size and/or use of iterative reconstruction technique.

CONTRAST:  75mL SUUQMB-E55 IOPAMIDOL (SUUQMB-E55) INJECTION 61%
FINDINGS: Cardiovascular: Heart size is normal. No pericardial effusion
identified. Mild coronary artery calcifications noted. Main
pulmonary artery is normal caliber. Thoracic aorta normal in course
and caliber.

Mediastinum/Nodes: No enlarged mediastinal, hilar, or axillary lymph
nodes. Thyroid gland, trachea, and esophagus demonstrate no
significant findings.

Lungs/Pleura: Lungs are clear. No pleural effusion or pneumothorax.

Upper Abdomen: No acute process visualized. Evidence of hepatic
steatosis. Colonic diverticulosis.

Musculoskeletal: No chest wall abnormality. No acute or significant
osseous findings.
IMPRESSION: 1. No acute intrathoracic process identified.
2. Colonic diverticulosis.
3. Evidence of hepatic steatosis.

## 2023-08-11 ENCOUNTER — Other Ambulatory Visit: Payer: Self-pay | Admitting: Family Medicine

## 2023-08-11 DIAGNOSIS — I2510 Atherosclerotic heart disease of native coronary artery without angina pectoris: Secondary | ICD-10-CM

## 2023-08-11 NOTE — Progress Notes (Signed)
Father w/ CAD at age 50. Pt w/ h/o HTN and obesity.  CAC ordered.

## 2023-08-23 ENCOUNTER — Ambulatory Visit (HOSPITAL_COMMUNITY)
Admission: RE | Admit: 2023-08-23 | Discharge: 2023-08-23 | Disposition: A | Discharge status: Home / Self Care | Payer: Self-pay | Source: Ambulatory Visit | Attending: Family Medicine | Admitting: Family Medicine

## 2023-08-23 ENCOUNTER — Encounter (HOSPITAL_COMMUNITY): Payer: Self-pay

## 2023-08-23 DIAGNOSIS — I2510 Atherosclerotic heart disease of native coronary artery without angina pectoris: Secondary | ICD-10-CM | POA: Insufficient documentation

## 2024-07-12 DIAGNOSIS — E119 Type 2 diabetes mellitus without complications: Secondary | ICD-10-CM

## 2024-07-12 HISTORY — DX: Type 2 diabetes mellitus without complications: E11.9

## 2024-10-15 ENCOUNTER — Ambulatory Visit (AMBULATORY_SURGERY_CENTER)

## 2024-10-15 VITALS — Ht 71.0 in | Wt 262.0 lb

## 2024-10-15 DIAGNOSIS — Z860100 Personal history of colon polyps, unspecified: Secondary | ICD-10-CM

## 2024-10-15 MED ORDER — NA SULFATE-K SULFATE-MG SULF 17.5-3.13-1.6 GM/177ML PO SOLN: 1.0000 | Freq: Once | ORAL | 0 refills | Status: AC

## 2024-10-15 NOTE — Progress Notes (Signed)
 PCP MD at time of PV: Alm Heads, MD __________________________________________________________________________________________________________________________________________  No egg allergy known to patient  No soy allergy known to patient No issues known to pt with past sedation with any surgeries or procedures Patient denies ever being told they had issues or difficulty with intubation  No FH of Malignant Hyperthermia Pt is not on diet pills Pt is not on  home 02  Pt is not on blood thinners  No A fib or A flutter Have any cardiac testing pending--no  LOA: independent  No Chew or Snuff tobacco __________________________________________________________________________________________________________________________________________  Constipation: no  Prep: suprep  __________________________________________________________________________________________________________________________________________  PV completed with patient. Prep instructions reviewed and provided during apt. Rx sent to preferred pharmacy.  __________________________________________________________________________________________________________________________________________  Patient's chart reviewed by Norleen Schillings CNRA prior to previsit and patient appropriate for the [facility].  Previsit completed and red dot placed by patient's name on their procedure day (on provider's schedule).

## 2024-10-21 ENCOUNTER — Encounter: Payer: Self-pay | Admitting: Gastroenterology

## 2024-10-28 ENCOUNTER — Ambulatory Visit (AMBULATORY_SURGERY_CENTER): Admitting: Gastroenterology

## 2024-10-28 ENCOUNTER — Encounter: Payer: Self-pay | Admitting: Gastroenterology

## 2024-10-28 VITALS — BP 111/68 | HR 73 | Temp 97.7°F | Resp 16 | Ht 71.0 in | Wt 262.0 lb

## 2024-10-28 DIAGNOSIS — K5730 Diverticulosis of large intestine without perforation or abscess without bleeding: Secondary | ICD-10-CM | POA: Diagnosis not present

## 2024-10-28 DIAGNOSIS — Z860101 Personal history of adenomatous and serrated colon polyps: Secondary | ICD-10-CM

## 2024-10-28 DIAGNOSIS — Z860100 Personal history of colon polyps, unspecified: Secondary | ICD-10-CM

## 2024-10-28 DIAGNOSIS — D122 Benign neoplasm of ascending colon: Secondary | ICD-10-CM

## 2024-10-28 DIAGNOSIS — Z1211 Encounter for screening for malignant neoplasm of colon: Secondary | ICD-10-CM

## 2024-10-28 MED ORDER — SODIUM CHLORIDE 0.9 % IV SOLN: 500.0000 mL | Freq: Once | INTRAVENOUS | Status: DC

## 2024-10-28 NOTE — Progress Notes (Signed)
 Vss nad trans to pacu

## 2024-10-28 NOTE — Progress Notes (Signed)
 Pt's states no medical or surgical changes since previsit or office visit.

## 2024-10-28 NOTE — Patient Instructions (Signed)
Discharge instructions given. Handouts on polyps and Diverticulosis. Resume previous medications. YOU HAD AN ENDOSCOPIC PROCEDURE TODAY AT THE [facility]:   Refer to the procedure report that was given to you for any specific questions about what was found during the examination.  If the procedure report does not answer your questions, please call your gastroenterologist to clarify.  If you requested that your care partner not be given the details of your procedure findings, then the procedure report has been included in a sealed envelope for you to review at your convenience later.  YOU SHOULD EXPECT: Some feelings of bloating in the abdomen. Passage of more gas than usual.  Walking can help get rid of the air that was put into your GI tract during the procedure and reduce the bloating. If you had a lower endoscopy (such as a colonoscopy or flexible sigmoidoscopy) you may notice spotting of blood in your stool or on the toilet paper. If you underwent a bowel prep for your procedure, you may not have a normal bowel movement for a few days.  Please Note:  You might notice some irritation and congestion in your nose or some drainage.  This is from the oxygen used during your procedure.  There is no need for concern and it should clear up in a day or so.  SYMPTOMS TO REPORT IMMEDIATELY:  Following lower endoscopy (colonoscopy or flexible sigmoidoscopy):  Excessive amounts of blood in the stool  Significant tenderness or worsening of abdominal pains  Swelling of the abdomen that is new, acute  Fever of 100F or higher  For urgent or emergent issues, a gastroenterologist can be reached at any hour by calling [phone]. Do not use MyChart messaging for urgent concerns.    DIET:  We do recommend a small meal at first, but then you may proceed to your regular diet.  Drink plenty of fluids but you should avoid alcoholic beverages for 24 hours.  ACTIVITY:  You should plan to take it  easy for the rest of today and you should NOT DRIVE or use heavy machinery until tomorrow (because of the sedation medicines used during the test).    FOLLOW UP: Our staff will call the number listed on your records the next business day following your procedure.  We will call around 7:15- 8:00 am to check on you and address any questions or concerns that you may have regarding the information given to you following your procedure. If we do not reach you, we will leave a message.     If any biopsies were taken you will be contacted by phone or by letter within the next 1-3 weeks.  Please call us at [phone] if you have not heard about the biopsies in 3 weeks.    SIGNATURES/CONFIDENTIALITY: You and/or your care partner have signed paperwork which will be entered into your electronic medical record.  These signatures attest to the fact that that the information above on your After Visit Summary has been reviewed and is understood.  Full responsibility of the confidentiality of this discharge information lies with you and/or your care-partner. 

## 2024-10-28 NOTE — Op Note (Signed)
 [facility] Patient Name: John Frederick Procedure Date: 10/28/2024 11:45 AM MRN: [id number] Endoscopist: Glendia E. Stacia , MD, [id number] Age: 53 Referring MD:  Date of Birth: March 10, 1971 Gender: Male Account #: [id number] Procedure:                Colonoscopy Indications:              Surveillance: High risk colon cancer surveillance:                            Personal history of adenoma (10 mm or greater in                            size), Personal history of multiple (3 or more)                            adenomas, Last colonoscopy: November 2022 Medicines:                Monitored Anesthesia Care Procedure:                Pre-Anesthesia Assessment:                           - Prior to the procedure, a History and Physical                            was performed, and patient medications and                            allergies were reviewed. The patient's tolerance of                            previous anesthesia was also reviewed. The risks                            and benefits of the procedure and the sedation                            options and risks were discussed with the patient.                            All questions were answered, and informed consent                            was obtained. [Anticoagulant Agents] [Ijbd Prior to                            Procedure]. [ASA Grade]. After reviewing the risks                            and benefits, the patient was deemed in                            satisfactory condition to undergo the procedure.  After obtaining informed consent, the colonoscope                            was passed under direct vision. Throughout the                            procedure, the patient's blood pressure, pulse, and                            oxygen saturations were monitored continuously. The                            Olympus Scope [id number] was introduced through the                             anus and advanced to the the terminal ileum, with                            identification of the appendiceal orifice and IC                            valve. The colonoscopy was performed without                            difficulty. The patient tolerated the procedure                            well. The quality of the bowel preparation was                            adequate. The terminal ileum, ileocecal valve,                            appendiceal orifice, and rectum were photographed.                            The bowel preparation used was SUPREP via split                            dose instruction. Scope In: 11:53:52 AM Scope Out: 12:13:20 PM Scope Withdrawal Time: 0 hours 15 minutes 27 seconds  Total Procedure Duration: 0 hours 19 minutes 28 seconds  Findings:                 The perianal and digital rectal examinations were                            normal. Pertinent negatives include normal                            sphincter tone and no palpable rectal lesions.                           A 3 mm polyp was found in the ascending colon. The  polyp was sessile. The polyp was removed with a                            cold snare. Resection and retrieval were complete.                            Estimated blood loss was minimal.                           Multiple medium-mouthed and small-mouthed                            diverticula were found in the sigmoid colon,                            descending colon, transverse colon and ascending                            colon. There was no evidence of diverticular                            bleeding.                           The exam was otherwise normal throughout the                            examined colon.                           The terminal ileum appeared normal.                           The retroflexed view of the distal rectum and anal                            verge was normal and  showed no anal or rectal                            abnormalities. Complications:            No immediate complications. Estimated Blood Loss:     Estimated blood loss was minimal. Impression:               - One 3 mm polyp in the ascending colon, removed                            with a cold snare. Resected and retrieved.                           - Moderate diverticulosis in the sigmoid colon, in                            the descending colon, in the transverse colon and  in the ascending colon. There was no evidence of                            diverticular bleeding.                           - The examined portion of the ileum was normal.                           - The distal rectum and anal verge are normal on                            retroflexion view. Recommendation:           - Patient has a contact number available for                            emergencies. The signs and symptoms of potential                            delayed complications were discussed with the                            patient. Return to normal activities tomorrow.                            Written discharge instructions were provided to the                            patient.                           - Resume previous diet.                           - Continue present medications.                           - Await pathology results.                           - Repeat colonoscopy in 5 years for surveillance. [name] E. Stacia, MD 10/28/2024 12:18:58 PM This report has been signed electronically.

## 2024-10-28 NOTE — Progress Notes (Signed)
 [facility] History and Physical   Primary Care Physician:  Remonia Alm PARAS, MD   Reason for Procedure:   Colon cancer screening/history of polyps  Plan:    Surveillance colonoscopy  The patient was provided an opportunity to ask questions and all were answered. The patient agreed with the plan    HPI: John Frederick is a 53 y.o. male undergoing surveillance colonoscopy.  He has no family history of colon cancer and no chronic GI symptoms. He underwent initial average risk screening in Nov 2022 and had 5 tubular adenomas removed, including one 10mm polyp in the ascending colon.   Past Medical History:  Diagnosis Date   Arthritis    left knee - no meds   Diabetes mellitus without complication (HCC) 07/2024   8.1 AIC   Hypertension     Past Surgical History:  Procedure Laterality Date   COLONOSCOPY     KNEE SURGERY Left    LEG SURGERY Right     Prior to Admission medications   Medication Sig Start Date End Date Taking? Authorizing Provider  calcipotriene (DOVONOX) 0.005 % cream Apply 1 application topically daily.   Yes [provider]  glucosamine-chondroitin 500-400 MG tablet Take 2 tablets by mouth daily.   Yes [provider]  hydrochlorothiazide (HYDRODIURIL) 50 MG tablet Take 50 mg by mouth daily.   Yes [provider]  lisinopril (PRINIVIL,ZESTRIL) 40 MG tablet Take 40 mg by mouth daily.   Yes [provider]  metFORMIN (GLUCOPHAGE) 500 MG tablet Take 1,000 mg by mouth 2 (two) times daily. 08/01/24  Yes [provider]  metoprolol succinate (TOPROL-XL) 50 MG 24 hr tablet Take 50 mg by mouth daily.   Yes [provider]  Na Sulfate-K Sulfate-Mg Sulfate concentrate (SUPREP) 17.5-3.13-1.6 GM/177ML SOLN  10/15/24  Yes [provider]  rosuvastatin (CRESTOR) 5 MG tablet Take 5 mg by mouth daily. 09/25/24  Yes [provider]  VTAMA 1 % CREA Apply 1 Application topically as needed. 09/27/24   Yes [provider]  betamethasone dipropionate 0.05 % cream Apply 1 Application topically as needed.    [provider]  ibuprofen (ADVIL,MOTRIN) 200 MG tablet Take 400 mg by mouth every 6 (six) hours as needed for moderate pain.    [provider]  OZEMPIC, 0.25 OR 0.5 MG/DOSE, 2 MG/3ML SOPN  10/13/24   [provider]    Current Outpatient Medications  Medication Sig Dispense Refill   calcipotriene (DOVONOX) 0.005 % cream Apply 1 application topically daily.     glucosamine-chondroitin 500-400 MG tablet Take 2 tablets by mouth daily.     hydrochlorothiazide (HYDRODIURIL) 50 MG tablet Take 50 mg by mouth daily.     lisinopril (PRINIVIL,ZESTRIL) 40 MG tablet Take 40 mg by mouth daily.     metFORMIN (GLUCOPHAGE) 500 MG tablet Take 1,000 mg by mouth 2 (two) times daily.     metoprolol succinate (TOPROL-XL) 50 MG 24 hr tablet Take 50 mg by mouth daily.     Na Sulfate-K Sulfate-Mg Sulfate concentrate (SUPREP) 17.5-3.13-1.6 GM/177ML SOLN      rosuvastatin (CRESTOR) 5 MG tablet Take 5 mg by mouth daily.     VTAMA 1 % CREA Apply 1 Application topically as needed.     betamethasone dipropionate 0.05 % cream Apply 1 Application topically as needed.     ibuprofen (ADVIL,MOTRIN) 200 MG tablet Take 400 mg by mouth every 6 (six) hours as needed for moderate pain.     OZEMPIC,  0.25 OR 0.5 MG/DOSE, 2 MG/3ML SOPN      Current Facility-Administered Medications  Medication Dose Route Frequency Provider Last Rate Last Admin   0.9 %  sodium chloride  infusion  500 mL Intravenous Once Stacia Glendia BRAVO, MD        Allergies as of 10/28/2024 - Review Complete 10/28/2024  Allergen Reaction Noted   Penicillins Hives and Itching 10/15/2024   Amoxicillin Rash 12/10/2017    Family History  Problem Relation Age of Onset   Hypertension Mother    Heart Problems Father    Alzheimer's disease Maternal Grandmother    Alzheimer's disease Paternal Grandmother    Colon cancer  Neg Hx    Colon polyps Neg Hx    Esophageal cancer Neg Hx    Rectal cancer Neg Hx    Stomach cancer Neg Hx     Social History   Socioeconomic History   Marital status: Married    Spouse name: PAULA   Number of children: 0   Years of education: COLLEGE   Highest education level: Not on file  Occupational History   Occupation: [employer]  Tobacco Use   Smoking status: Former    Current packs/day: 0.00    Types: Cigarettes    Quit date: 12/13/2003    Years since quitting: 20.8   Smokeless tobacco: Never  Vaping Use   Vaping status: Never Used  Substance and Sexual Activity   Alcohol use: Yes    Alcohol/week: 28.0 standard drinks of alcohol    Types: 28 Cans of beer per week    Comment: daily beer 4 daily   Drug use: No   Sexual activity: Yes  Other Topics Concern   Not on file  Social History Narrative   Not on file   Social Drivers of Health   Financial Resource Strain: Not on file  Food Insecurity: Not on file  Transportation Needs: Not on file  Physical Activity: Not on file  Stress: Not on file  Social Connections: Not on file  Intimate Partner Violence: Not on file    Review of Systems:  All other review of systems negative except as mentioned in the HPI.  Physical Exam: Vital signs BP 130/74   Pulse 76   Temp 97.7 F (36.5 C) (Temporal)   Ht 5' 11 (1.803 m)   Wt 262 lb (118.8 kg)   SpO2 99%   BMI 36.54 kg/m   General:   Alert,  Well-developed, well-nourished, pleasant and cooperative in NAD Airway:  Mallampati 3 Lungs:  Clear throughout to auscultation.   Heart:  Regular rate and rhythm; no murmurs, clicks, rubs,  or gallops. Abdomen:  Soft, nontender and nondistended. Normal bowel sounds.   Neuro/Psych:  Normal mood and affect. A and O x 3   John Frederick E. Stacia, MD [facility]

## 2024-10-28 NOTE — Progress Notes (Signed)
 Called to room to assist during endoscopic procedure.  Patient ID and intended procedure confirmed with present staff. Received instructions for my participation in the procedure from the performing physician.

## 2024-10-29 ENCOUNTER — Telehealth: Payer: Self-pay

## 2024-10-29 NOTE — Telephone Encounter (Signed)
  Follow up Call-     10/28/2024   11:00 AM  Call back number  Post procedure Call Back phone  # [phone]  Permission to leave phone message Yes     Patient questions:  Do you have a fever, pain , or abdominal swelling? No. Pain Score  0 *  Have you tolerated food without any problems? Yes.    Have you been able to return to your normal activities? Yes.    Do you have any questions about your discharge instructions: Diet   No. Medications  No. Follow up visit  No.  Do you have questions or concerns about your Care? No.  Actions: * If pain score is 4 or above: No action needed, pain <4.

## 2024-10-30 LAB — SURGICAL PATHOLOGY

## 2024-11-02 ENCOUNTER — Ambulatory Visit: Payer: Self-pay | Admitting: Gastroenterology

## 2024-11-02 NOTE — Progress Notes (Signed)
 John Frederick,  The polyp which I removed during your recent procedure was proven to be completely benign but is considered a pre-cancerous polyp that MAY have grown into cancer if it had not been removed.  Studies shows that at least 20% of women over age 51 and 30% of men over age 40 have pre-cancerous polyps.  Based on your history of multiple precancerous polyps on your previous colonoscopy, I recommend that you have a repeat colonoscopy in 5 years.   If you develop any new rectal bleeding, abdominal pain or significant bowel habit changes, please contact me before then.
